# Patient Record
Sex: Male | Born: 1965 | Race: White | Hispanic: No | Smoking: Current every day smoker
Health system: Southern US, Community
[De-identification: ages and names within clinical notes are randomized; demographics above are authoritative.]

---

## 2015-04-27 ENCOUNTER — Encounter (HOSPITAL_BASED_OUTPATIENT_CLINIC_OR_DEPARTMENT_OTHER): Payer: Self-pay | Admitting: *Deleted

## 2015-04-27 ENCOUNTER — Emergency Department (HOSPITAL_BASED_OUTPATIENT_CLINIC_OR_DEPARTMENT_OTHER)
Admission: EM | Admit: 2015-04-27 | Discharge: 2015-04-27 | Disposition: A | Discharge status: Home / Self Care | Payer: Self-pay | Attending: Physician Assistant | Admitting: Physician Assistant

## 2015-04-27 ENCOUNTER — Emergency Department (HOSPITAL_BASED_OUTPATIENT_CLINIC_OR_DEPARTMENT_OTHER): Payer: Self-pay

## 2015-04-27 DIAGNOSIS — Y998 Other external cause status: Secondary | ICD-10-CM | POA: Insufficient documentation

## 2015-04-27 DIAGNOSIS — S065X0A Traumatic subdural hemorrhage without loss of consciousness, initial encounter: Secondary | ICD-10-CM | POA: Insufficient documentation

## 2015-04-27 DIAGNOSIS — S199XXA Unspecified injury of neck, initial encounter: Secondary | ICD-10-CM | POA: Insufficient documentation

## 2015-04-27 DIAGNOSIS — F17200 Nicotine dependence, unspecified, uncomplicated: Secondary | ICD-10-CM | POA: Insufficient documentation

## 2015-04-27 DIAGNOSIS — Y9289 Other specified places as the place of occurrence of the external cause: Secondary | ICD-10-CM | POA: Insufficient documentation

## 2015-04-27 DIAGNOSIS — W1789XA Other fall from one level to another, initial encounter: Secondary | ICD-10-CM | POA: Insufficient documentation

## 2015-04-27 DIAGNOSIS — I6200 Nontraumatic subdural hemorrhage, unspecified: Secondary | ICD-10-CM

## 2015-04-27 DIAGNOSIS — Y9389 Activity, other specified: Secondary | ICD-10-CM | POA: Insufficient documentation

## 2015-04-27 MED ORDER — OXYCODONE-ACETAMINOPHEN 5-325 MG PO TABS: 1.0000 | ORAL_TABLET | Freq: Four times a day (QID) | ORAL | Status: DC | PRN

## 2015-04-27 MED ORDER — IBUPROFEN 800 MG PO TABS
800.0000 mg | ORAL_TABLET | Freq: Once | ORAL | Status: AC
  Administered 2015-04-27: 800 mg via ORAL
  Filled 2015-04-27: qty 1
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
## 2015-04-27 NOTE — ED Notes (Signed)
Laceration noted at occipital area, no active bleeding noted at this time.

## 2015-04-27 NOTE — ED Notes (Signed)
Up to bathroom, ambulated without assistance, gait very steady
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
## 2015-04-27 NOTE — ED Notes (Signed)
DC instructions reviewed with pt, discussed s&s of when to return to the ED, also stressed when returns back home to obtain a primary care MD. Memory Argueffered opportunity for questions, Teach Back Method Used

## 2015-04-27 NOTE — ED Notes (Signed)
MD at bedside. 

## 2015-04-27 NOTE — ED Notes (Signed)
Pt tolerating PO fluids and solids well, ambulating in room, gait very steady, pt very alert and oriented x 4, mae x4, voices no complaints at this time

## 2015-04-27 NOTE — ED Notes (Signed)
Pt resting quietly with eyes closed, appears to be sleeping, responds to light verbal stimuli

## 2015-04-27 NOTE — Discharge Instructions (Signed)
You were seen today after you fell. YOu had a laceration repaired with staples which should be removed in 7 days.  IN addition you had a small head bleed- a subdural. We discussed wth neurosurgery who wanted to watch for 6 hours, if no acute change you are safe to go home. Please go ot the ED if you have any increase in headache, trouble speaking, blacking out or othersymptoms. You will need to follow up with neurosrugery as an outpatient.   Do not take ibuprofen or any blood thinners  Subdural Hematoma A subdural hematoma is a collection of blood between the brain and its tough outermost membrane covering (the dura). Blood clots that form in this area push down on the brain and cause irritation. A subdural hematoma may cause parts of the brain to stop working and eventually cause death.  CAUSES A subdural hematoma is caused by bleeding from a ruptured blood vessel (hemorrhage). The bleeding results from trauma to the head, such as from a fall or motor vehicle accident. There are two types of subdural hemorrhages:  Acute. This type develops shortly after a serious blow to the head and causes blood to collect very quickly. If not diagnosed and treated promptly, severe brain injury or death can occur.  Chronic. This is when bleeding develops more slowly, over weeks or months. RISK FACTORS People at risk for subdural hematoma include older persons, infants, and alcoholics. SYMPTOMS An acute subdural hemorrhage develops over minutes to hours. Symptoms can include:  Temporary loss of consciousness.  Weakness of arms or legs on one side of the body.  Changes in vision or speech.  A severe headache.  Seizures.  Nausea and vomiting.  Increased sleepiness. A chronic subdural hemorrhage develops over weeks to months. Symptoms may develop slowly and produce less noticeable problems or changes. Symptoms include:  A mild headache.  A change in personality.  Loss of balance or difficulty  walking.  Weakness, numbness, or tingling in the arms or legs.  Nausea or vomiting.  Memory loss.  Double vision.  Increased sleepiness. DIAGNOSIS Your health care provider will perform a thorough physical and neurological exam. A CT scan or MRI may also be done. If there is blood on the scan, its color will help your health care provider determine how long the hemorrhage has been there. TREATMENT If the cause is an acute subdural hemorrhage, immediate treatment is needed. In many cases an emergency surgery is performed to drain accumulated blood or to remove the blood clot. Sometimes steroid or diuretic medicines or controlled breathing through a ventilator is needed to decrease pressure in the brain. This is especially true if there is any swelling of the brain. If the cause is a chronic subdural hemorrhage, treatment depends on a variety of factors. Sometimes no treatment is needed. If the subdural hematoma is small and causes minimal or no symptoms, you may be treated with bed rest, medicines, and observation. If the hemorrhage is large or if you have neurological symptoms, an emergency surgery is usually needed to remove the blood clot. People who develop a subdural hemorrhage are at risk of developing seizures, even after the subdural hematoma has been treated. You may be prescribed an anti-seizure (anticonvulsant) medicine for a year or longer. HOME CARE INSTRUCTIONS  Only take medicines as directed by your health care provider.  Rest if directed by your health care provider.  Keep all follow-up appointments with your health care provider.  If you play a contact sport such  as football, hockey or soccer and you experienced a significant head injury, allow enough time for healing (up to 15 days) before you start playing again. A repeated injury that occurs during this fragile repair period is likely to result in hemorrhage. This is called the second impact syndrome. SEEK IMMEDIATE  MEDICAL CARE IF:  You fall or experience minor trauma to your head and you are taking blood thinners. If you are on any blood thinners even a very small injury can cause a subdural hematoma. You should not hesitate to seek medical attention regardless of how minor you think your symptoms are.  You experience a head injury and have:  Drowsiness or a decrease in alertness.  Confusion or forgetfulness.  Slurred speech.  Irrational or aggressive behavior.  Numbness or paralysis in any part of the body.  A feeling of being sick to your stomach (nauseous) or you throw up (vomit).  Difficulty walking or poor coordination.  Double vision.  Seizures.  A bleeding disorder.  A history of heavy alcohol use.  Clear fluid draining from your nose or ears.  Personality changes.  Difficulty thinking.  Worsening symptoms. MAKE SURE YOU:  Understand these instructions.  Will watch your condition.  Will get help right away if you are not doing well or get worse. FOR MORE INFORMATION National Institute of Neurological Disorders and Stroke: [place]Automobile.co.ukwww.ninds.nih.gov American Association of Neurological Surgeons: www.neurosurgerytoday.org American Academy of Neurology (AAN): ComparePet.czwww.aan.com Brain Injury Association of America: www.biausa.org   This information is not intended to replace advice given to you by your health care provider. Make sure you discuss any questions you have with your health care provider.   Document Released: 03/08/2004 Document Revised: 02/09/2013 Document Reviewed: 10/22/2012 Elsevier Interactive Patient Education 2016 [employer] Inc[employer] Intracranial Hemorrhage An intracranial hemorrhage is bleeding in the layers between the skull (cranium) and brain. A blood vessel bursts and allows blood to leak inside the cranial cavity. The leaking blood then collects (hematoma). This causes pressure and damage to brain cells. The bleeding can be mild to severe. In severe cases, it can lead  to permanent damage or death. Symptoms may come on suddenly or develop over time. Early diagnosis and treatment leads to better recovery.  There are four types of intracranial hemorrhage: subarachnoid, subdural, extradural, or cerebral hemorrhage. CAUSES   Head injury (trauma).  Ruptured brain aneurysm.  Bleeding from blood vessels that develop abnormally (arteriovenous malformation).  Bleeding disorder.  Use of blood thinners (anticoagulants).  Use of certain drugs, such as cocaine. For some people with intracranial hemorrhage, the cause is unknown.  RISK FACTORS  Using tobacco products, such as cigarettes and chewing tobacco.  Having high blood pressure (hypertension).  Abusing alcohol.  Being a male, especially of postmenopausal age.  Having a family history of disease in the blood vessels of the brain (cerebrovascular disease).  Having certain genetic syndromes that result in kidney disease or connective tissue disease. SIGNS AND SYMPTOMS  A sudden, severe headache with no known cause. The headache is often described as the worst headache ever experienced.  Nausea or vomiting, especially when combined with other symptoms such as a headache.  Sudden weakness or numbness of the face, arm, or leg, especially on one side of the body.  Sudden trouble walking or difficulty moving arms or legs.  Sudden confusion.  Sudden personality changes.  Trouble speaking (aphasia) or understanding.  Difficulty swallowing.  Sudden trouble seeing in one or both eyes.  Double vision.  Dizziness.  Loss of  balance or coordination.  Intolerance to light.  Stiff neck. DIAGNOSIS  Your health care provider will perform a physical exam and ask about your symptoms. If an intracranial hemorrhage is suspected, various tests may be ordered. These tests may include:   A CT scan.  An MRI.  A cerebral angiogram.  A spinal tap (lumbar puncture).  Blood  tests. TREATMENT Immediate treatment in the hospital is often required to reduce the risk of brain damage. Treatment will depend on the cause of the bleeding, where it is located, and the extent of the bleeding and damage. The goals of treatment include stopping the bleeding, repairing the cause of bleeding, providing relief of symptoms, and preventing problems.   Medicines may be given to:  Lower blood pressure (antihypertensives).  Relieve pain (analgesics).  Relieve nausea or vomiting.  Surgery may be needed to stop the bleeding, repair the cause of the bleeding, or remove the blood.  Rehabilitation may be needed to improve any cognitive and day-to-day functions impaired by the condition. Further treatment depends on the duration, severity, and cause of your symptoms. Physical, speech, and occupational therapists will assess you and work to improve any functions impaired by the intracranial hemorrhage. Measures will be taken to prevent short-term and long-term problems, including infection from breathing foreign material into the lungs (aspiration pneumonia), blood clots in the legs, bedsores, and falls. HOME CARE INSTRUCTIONS  Take medicines only as directed by your health care provider.  Eat healthy foods as directed by your health care provider:  A diet low in salt (sodium), saturated fat, trans fat, and cholesterol may be recommended to manage your blood pressure.  Foods may need to be soft or pureed, or small bites may need to be taken in order to avoid aspirating or choking.  If studies show that your ability to swallow safely has been affected, you may need to seek help from specialists such as a dietitian, speech and language pathologist, or an occupational therapist. These health care providers can teach you how to safely get the nutrition your body needs.  Rest and limit activities or movements as directed by your health care provider.  Do not use any tobacco products  including cigarettes, chewing tobacco, or electronic cigarettes. If you need help quitting, ask your health care provider.  Limit alcohol intake to no more than 1 drink per day for nonpregnant women and 2 drinks per day for men. One drink equals 12 ounces of beer, 5 ounces of wine, or 1 ounces of hard liquor.  Make any other lifestyle changes as directed by your health care provider.  Monitor and record your blood pressure as directed by your health care provider.  A safe home environment is important to reduce the risk of falls. Your health care provider may arrange for specialists to evaluate your home. Having grab bars in the bedroom and bathroom is often important. Your health care provider may arrange for special equipment to be used at home, such as raised toilets and a seat for the shower.  Do physical, occupational, and speech therapy as directed by your health care provider. Ongoing therapy may be needed to maximize your recovery.  Use a walker or a cane at all times if directed by your health care provider.  Keep all follow-up visits with your health care provider and other specialists. This is important. This includes any referrals, physical therapy, and rehabilitation. SEEK IMMEDIATE MEDICAL CARE IF:   You have a sudden, severe headache with no known  cause.  You have nausea or vomiting occurring with another symptom.  You have sudden weakness or numbness of the face, arm, or leg, especially on one side of the body.  You have sudden trouble walking or difficulty moving your arms or legs.  You have sudden confusion.  You have trouble speaking (aphasia) or understanding.  You have sudden trouble seeing in one or both eyes.  You have a sudden loss of balance or coordination.  You have a stiff neck.  You have difficulty breathing.  You have a partial or total loss of consciousness. These symptoms may represent a serious problem that is an emergency. Do not wait to see if  the symptoms will go away. Get medical help right away. Call your local emergency services (911 in the [place] Do not drive yourself to the hospital.   This information is not intended to replace advice given to you by your health care provider. Make sure you discuss any questions you have with your health care provider.   Document Released: 11/16/2013 Document Reviewed: 11/16/2013 Elsevier Interactive Patient Education [employer]

## 2017-08-08 IMAGING — CT CT CERVICAL SPINE W/O CM
4 of 6 series · 13 of 33 positions shown, 15 images · non-contrast
Comparison: None.

CLINICAL DATA: 49-year-old who fell off of the back of his tractor
trailer, sustaining a laceration to the back of the head. Patient
amnestic to the event. Initial encounter.

EXAM:
CT HEAD WITHOUT CONTRAST
CT CERVICAL SPINE WITHOUT CONTRAST
TECHNIQUE: Multidetector CT imaging of the head and cervical spine was
performed following the standard protocol without intravenous
contrast. Multiplanar CT image reconstructions of the cervical spine
were also generated.

[Series 6: c_spine 2.0 b41s st · axial · 0.34mm/px · z∈[-285,-217]mm · 2 of 102 slices shown]
[im 34/102  bone]
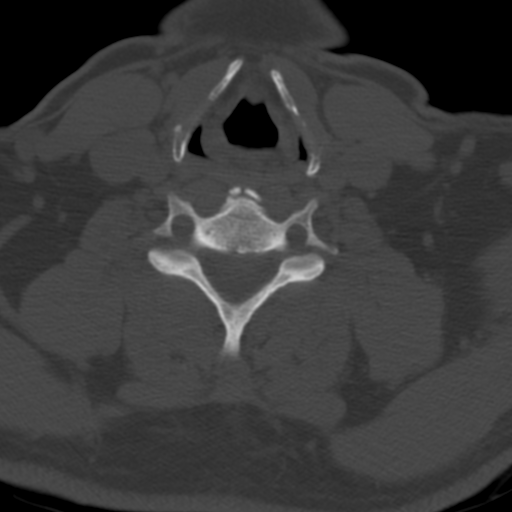
[im 68/102  bone]
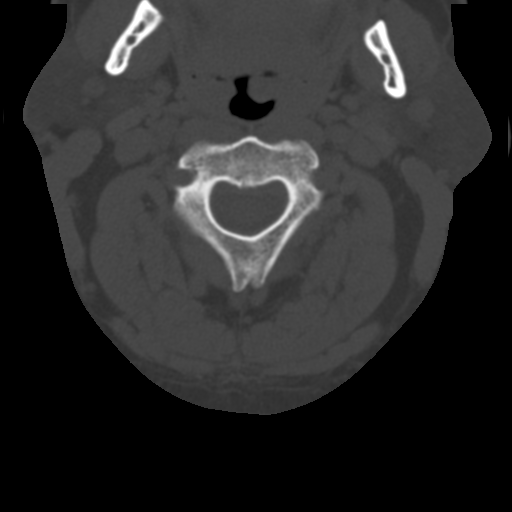

[Series 9: c_spine 2.0 coronal · coronal · 0.42mm/px · 3 of 88 slices shown]
[im 18/88  bone]
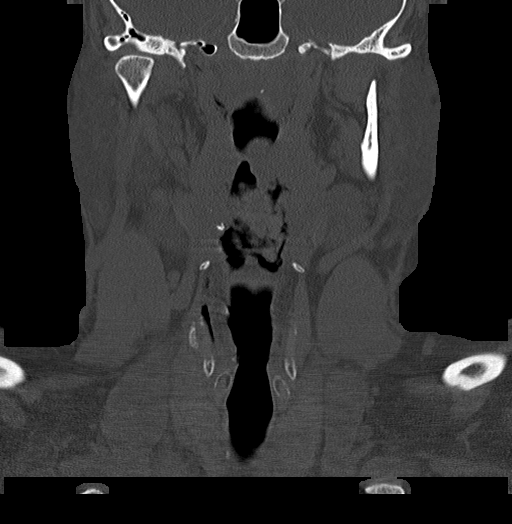
[im 35/88  bone]
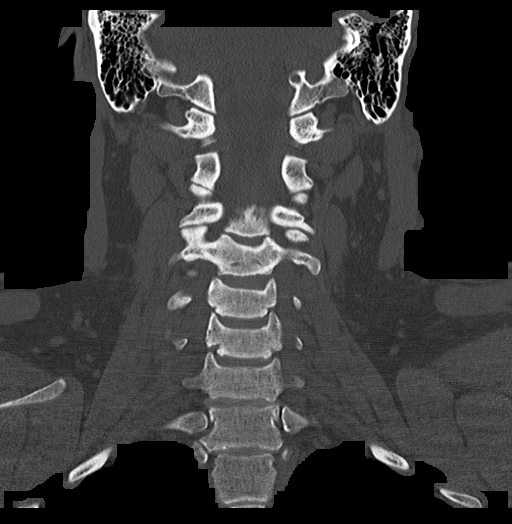
[im 53/88  bone]
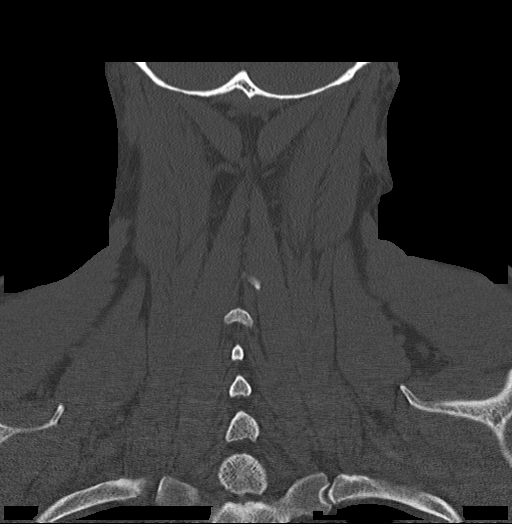

[Series 10: c_spine 2.0 sagittal · sagittal · 0.43mm/px · 5 of 93 slices shown, 6 images]
[im 31/93  bone]
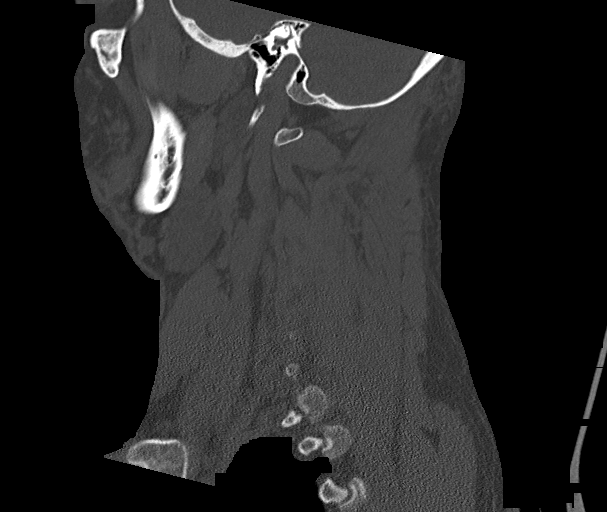
[im 39/93  bone]
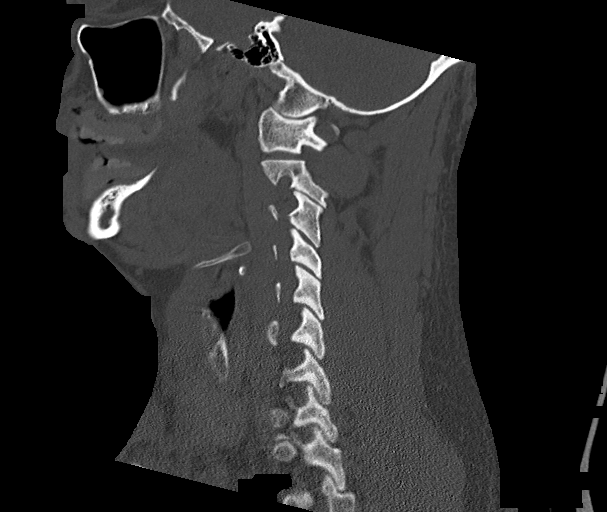
[im 47/93  soft-tissue]
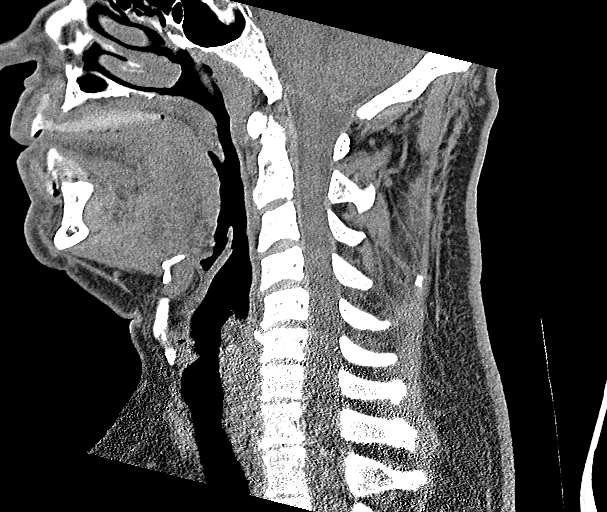
[im 47/93  bone]
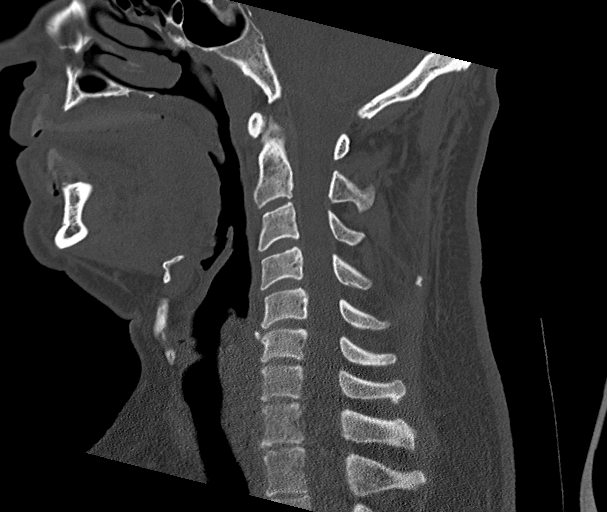
[im 54/93  bone]
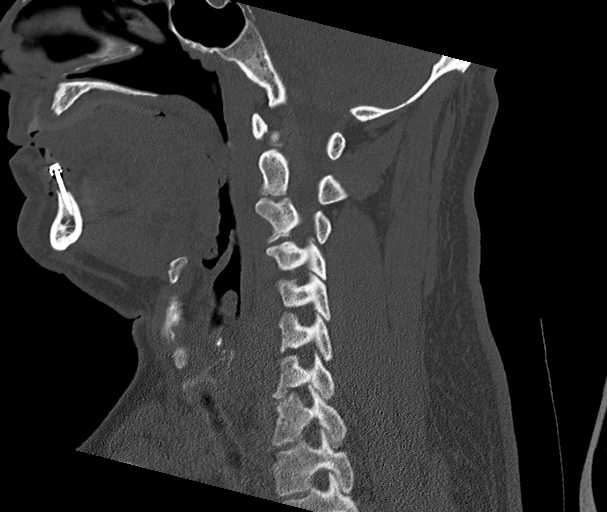
[im 62/93  bone]
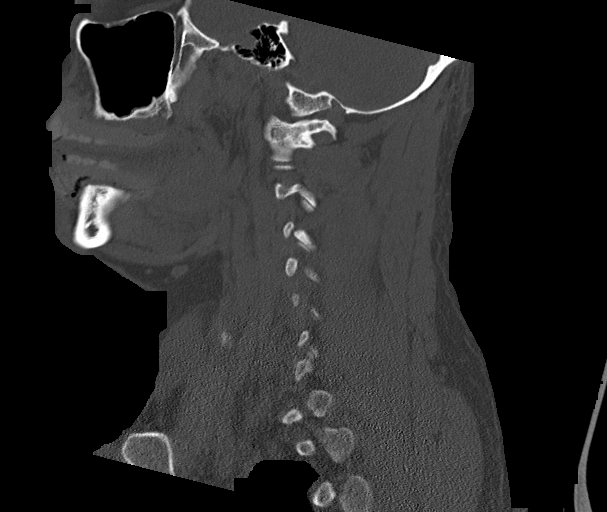

[Series 11: c_spine 2.0 orth ax · axial · 0.35mm/px · z∈[-340,-222]mm · 3 of 124 slices shown, 4 images]
[im 31/124  soft-tissue]
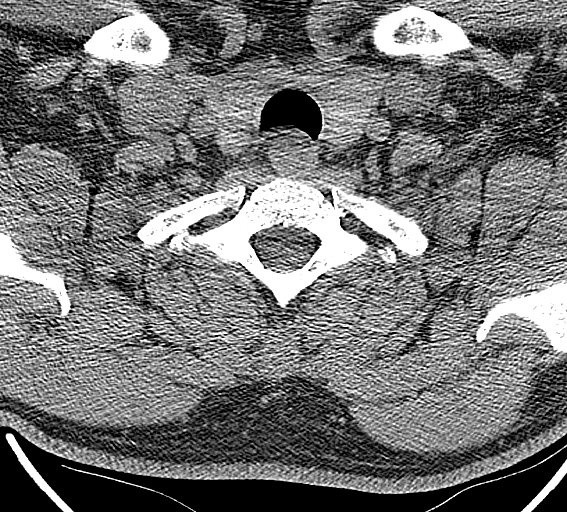
[im 31/124  bone]
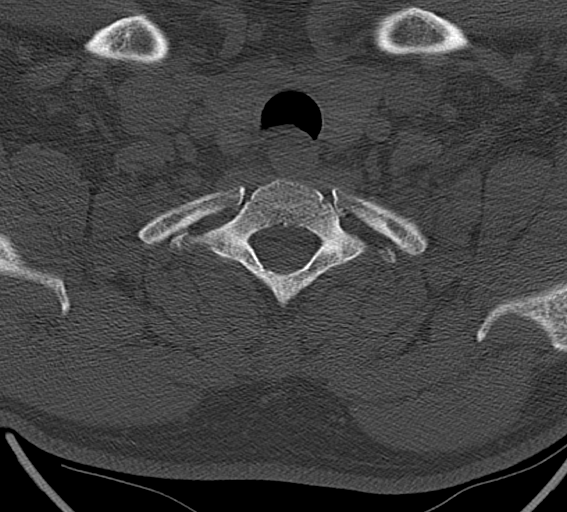
[im 62/124  bone]
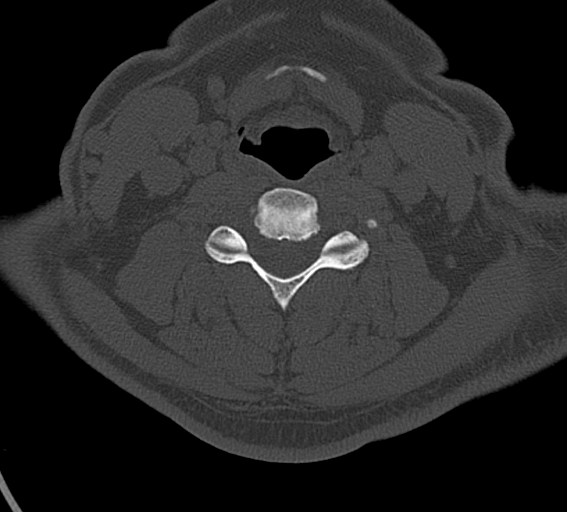
[im 93/124  bone]
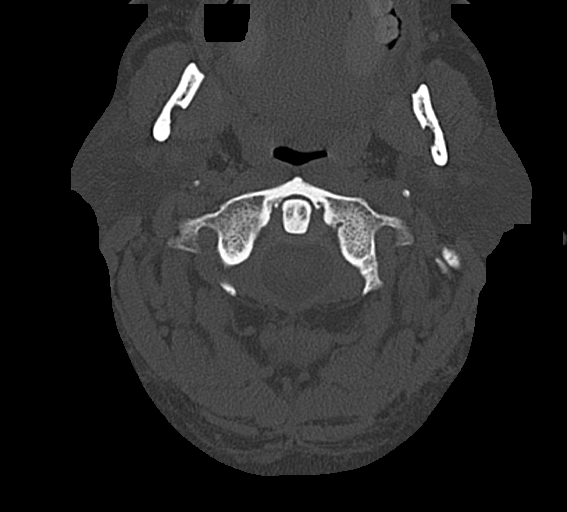

[13 of 33 positions shown; findings below may reference images not displayed]

FINDINGS: CT HEAD FINDINGS

Very small anterior left parafalcine subdural hematoma, maximum
thickness 3 mm. No associated mass effect or midline shift. No acute
hemorrhage or hematoma elsewhere. Ventricular system normal in size
and appearance for age. No focal brain parenchymal abnormality.

Small left posterior parietal scalp hematoma near the vertex with
surgical skin staples closing the overlying laceration. No
underlying skull fracture. Visualized paranasal sinuses, bilateral
mastoid air cells and bilateral middle ear cavities well-aerated.
Mild bilateral carotid siphon atherosclerosis.

CT CERVICAL SPINE FINDINGS

No fractures identified involving the cervical spine. Sagittal
reconstructed images demonstrate anatomic alignment in straightening
of the usual lordosis. Mild disc space narrowing C5-6 and C6-7.
Calcification in the anterior annular fibers at C5-6. No spinal
stenosis. Facet joints intact throughout. Coronal reformatted images
demonstrate an intact craniocervical junction, intact C1-C2
articulation with degenerative changes, intact dens, and intact
lateral masses throughout. Predominantly facet hypertrophy accounts
for mild bilateral foraminal stenoses at C3-4, mild right and
moderate left foraminal stenosis at C4-5, moderate right foraminal
stenosis at C5-6 and mild bilateral foraminal stenoses at C6-7 and
C7-T1.
IMPRESSION: 1. Very small anterior left parafalcine subdural hematoma, maximum
thickness 3 mm, without associated mass effect or midline shift.
2. No acute intracranial abnormality otherwise.
3. Small left posterior parietal scalp hematoma near the vertex
without underlying skull fracture.
4. No fractures identified involving the cervical spine.
5. Mild degenerative changes in the cervical spine as detailed
above.
6. Bilateral carotid siphon atherosclerosis, somewhat advanced for
patient age.
Critical Value/emergent results were telephoned by me personally at
the time of interpretation on 04/27/2015 at [DATE] to Dr. SOUNDRARAJAN
SILKE, who verbally acknowledged these results.
